# Patient Record
Sex: Male | Born: 1980 | Race: Black or African American | Hispanic: No | Marital: Married
Health system: Southern US, Community
[De-identification: ages and names within clinical notes are randomized; demographics above are authoritative.]

---

## 2018-05-14 ENCOUNTER — Emergency Department (HOSPITAL_COMMUNITY): Payer: BC Managed Care – PPO

## 2018-05-14 ENCOUNTER — Emergency Department (HOSPITAL_COMMUNITY)
Admission: EM | Admit: 2018-05-14 | Discharge: 2018-05-14 | Disposition: A | Discharge status: Home / Self Care | Payer: BC Managed Care – PPO | Attending: Emergency Medicine | Admitting: Emergency Medicine

## 2018-05-14 ENCOUNTER — Encounter (HOSPITAL_COMMUNITY): Payer: Self-pay | Admitting: Emergency Medicine

## 2018-05-14 DIAGNOSIS — Y929 Unspecified place or not applicable: Secondary | ICD-10-CM | POA: Insufficient documentation

## 2018-05-14 DIAGNOSIS — R079 Chest pain, unspecified: Secondary | ICD-10-CM | POA: Insufficient documentation

## 2018-05-14 DIAGNOSIS — Y939 Activity, unspecified: Secondary | ICD-10-CM | POA: Diagnosis not present

## 2018-05-14 DIAGNOSIS — G44319 Acute post-traumatic headache, not intractable: Secondary | ICD-10-CM | POA: Insufficient documentation

## 2018-05-14 DIAGNOSIS — R51 Headache: Secondary | ICD-10-CM | POA: Diagnosis present

## 2018-05-14 DIAGNOSIS — S86812A Strain of other muscle(s) and tendon(s) at lower leg level, left leg, initial encounter: Secondary | ICD-10-CM | POA: Insufficient documentation

## 2018-05-14 DIAGNOSIS — Y999 Unspecified external cause status: Secondary | ICD-10-CM | POA: Insufficient documentation

## 2018-05-14 MED ORDER — ACETAMINOPHEN 325 MG PO TABS
650.0000 mg | ORAL_TABLET | Freq: Once | ORAL | Status: AC
  Administered 2018-05-14: 650 mg via ORAL
  Filled 2018-05-14: qty 2

## 2018-05-14 MED ORDER — METHOCARBAMOL 500 MG PO TABS: 500.0000 mg | ORAL_TABLET | Freq: Two times a day (BID) | ORAL | 0 refills | Status: AC
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
## 2018-05-14 NOTE — Discharge Instructions (Signed)
Scan of your home and x-ray of your chest were reassuring  Please take 800 mg ibuprofen every 6 hours as needed for pain.  I also written a prescription for a muscle relaxer medicine called Robaxin.  This medicine can make you drowsy so please do not drive, work or drink alcohol while taking it.  Return to the ER if you have any new or concerning symptoms like worsening headache with vision changes, headache with vomiting, new numbness or weakness.
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
## 2018-05-15 ENCOUNTER — Encounter (HOSPITAL_COMMUNITY): Payer: Self-pay | Admitting: Emergency Medical Services
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
## 2023-05-27 ENCOUNTER — Other Ambulatory Visit: Payer: Self-pay

## 2023-05-27 ENCOUNTER — Emergency Department
Admission: EM | Admit: 2023-05-27 | Discharge: 2023-05-27 | Disposition: A | Discharge status: Home / Self Care | Payer: Self-pay | Attending: Emergency Medicine | Admitting: Emergency Medicine

## 2023-05-27 ENCOUNTER — Emergency Department: Payer: BC Managed Care – PPO

## 2023-05-27 DIAGNOSIS — Z23 Encounter for immunization: Secondary | ICD-10-CM | POA: Insufficient documentation

## 2023-05-27 DIAGNOSIS — S0101XA Laceration without foreign body of scalp, initial encounter: Secondary | ICD-10-CM | POA: Diagnosis not present

## 2023-05-27 DIAGNOSIS — Y990 Civilian activity done for income or pay: Secondary | ICD-10-CM | POA: Diagnosis not present

## 2023-05-27 DIAGNOSIS — W228XXA Striking against or struck by other objects, initial encounter: Secondary | ICD-10-CM | POA: Insufficient documentation

## 2023-05-27 DIAGNOSIS — S0990XA Unspecified injury of head, initial encounter: Secondary | ICD-10-CM

## 2023-05-27 MED ORDER — LIDOCAINE-EPINEPHRINE-TETRACAINE (LET) TOPICAL GEL
3.0000 mL | Freq: Once | TOPICAL | Status: AC
  Administered 2023-05-27: 3 mL via TOPICAL
  Filled 2023-05-27: qty 3

## 2023-05-27 MED ORDER — BACITRACIN ZINC 500 UNIT/GM EX OINT
TOPICAL_OINTMENT | Freq: Once | CUTANEOUS | Status: AC
  Administered 2023-05-27: 1 via TOPICAL
  Filled 2023-05-27: qty 0.9

## 2023-05-27 MED ORDER — TETANUS-DIPHTH-ACELL PERTUSSIS 5-2.5-18.5 LF-MCG/0.5 IM SUSY
0.5000 mL | PREFILLED_SYRINGE | Freq: Once | INTRAMUSCULAR | Status: AC
  Administered 2023-05-27: 0.5 mL via INTRAMUSCULAR
  Filled 2023-05-27: qty 0.5

## 2023-05-27 MED ORDER — ACETAMINOPHEN 325 MG PO TABS
650.0000 mg | ORAL_TABLET | Freq: Once | ORAL | Status: AC
  Administered 2023-05-27: 650 mg via ORAL
  Filled 2023-05-27: qty 2
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
## 2023-05-27 NOTE — ED Provider Notes (Signed)
[facility] Provider Note    Event Date/Time   First MD Initiated Contact with Patient 05/27/23 2130     (approximate)   History   Laceration   HPI  Leonard Taylor is a 42 y.o. male with no significant past medical history presents emergency department with a head injury.  Patient was lifting something to put on belt when he hit his head and got a laceration to the top of his scalp.      Physical Exam   Triage Vital Signs: ED Triage Vitals [05/27/23 2122]  Enc Vitals Group     BP (!) 141/98     Pulse Rate 70     Resp 18     Temp 98.1 F (36.7 C)     Temp Source Oral     SpO2 100 %     Weight 170 lb (77.1 kg)     Height 5\' 10"  (1.778 m)     Head Circumference      Peak Flow      Pain Score 8     Pain Loc      Pain Edu?      Excl. in GC?     Most recent vital signs: Vitals:   05/27/23 2122  BP: (!) 141/98  Pulse: 70  Resp: 18  Temp: 98.1 F (36.7 C)  SpO2: 100%     General: Awake, no distress.   CV:  Good peripheral perfusion. regular rate and  rhythm Resp:  Normal effort.  Abd:  No distention.   Other:  Cranial nerves II through XII grossly intact, skull some tenderness surrounding the laceration, laceration about 1 cm long, no foreign body, neurovascular intact   ED Results / Procedures / Treatments   Labs (all labs ordered are listed, but only abnormal results are displayed) Labs Reviewed - No data to display   EKG     RADIOLOGY CT of the head    PROCEDURES:   .Marland KitchenLaceration Repair  Date/Time: 05/27/2023 10:44 PM  Performed by: Faythe Ghee, PA-C Authorized by: Faythe Ghee, PA-C   Consent:    Consent obtained:  Verbal   Consent given by:  Patient   Risks, benefits, and alternatives were discussed: yes     Risks discussed:  Infection, pain and retained foreign body   Alternatives discussed:  No treatment Universal protocol:    Procedure explained and questions answered to patient or proxy's  satisfaction: yes     Immediately prior to procedure, a time out was called: yes     Patient identity confirmed:  Verbally with patient Anesthesia:    Anesthesia method:  Topical application   Topical anesthetic:  LET Laceration details:    Location:  Scalp   Scalp location:  Crown   Length (cm):  1 Exploration:    Limited defect created (wound extended): no     Hemostasis achieved with:  LET   Imaging outcome: foreign body not noted     Wound exploration: wound explored through full range of motion and entire depth of wound visualized     Wound extent: areolar tissue not violated, fascia not violated, no foreign body, no signs of injury, no nerve damage, no tendon damage, no underlying fracture and no vascular damage     Contaminated: no   Treatment:    Area cleansed with:  Saline   Amount of cleaning:  Standard   Irrigation solution:  Sterile saline   Irrigation method:  Tap Skin  repair:    Repair method:  Staples   Number of staples:  2 Approximation:    Approximation:  Close    MEDICATIONS ORDERED IN ED: Medications  Tdap (BOOSTRIX) injection 0.5 mL (has no administration in time range)  bacitracin ointment (has no administration in time range)  acetaminophen (TYLENOL) tablet 650 mg (650 mg Oral Given 05/27/23 2229)  lidocaine-EPINEPHrine-tetracaine (LET) topical gel (3 mLs Topical Given 05/27/23 2229)     IMPRESSION / MDM / ASSESSMENT AND PLAN / ED COURSE  I reviewed the triage vital signs and the nursing notes.                              Differential diagnosis includes, but is not limited to, subdural, subarachnoid, contusion, laceration  Patient's presentation is most consistent with Patient's presentation is most consistent with acute presentation with potential threat to life or bodily function.    CT of the head independently reviewed interpreted by me as being negative for acute abnormality  Procedure note for laceration repair  Patient tolerated  procedure well.  Explained CT findings.  Tdap was updated.  He is to follow-up with his regular doctor or urgent care for staple removal in 10 days.  Patient is in agreement treatment plan.  Discharged stable condition.      FINAL CLINICAL IMPRESSION(S) / ED DIAGNOSES   Final diagnoses:  Laceration of scalp, initial encounter  Minor head injury, initial encounter     Rx / DC Orders   ED Discharge Orders     None        Note:  This document was prepared using Dragon voice recognition software and may include unintentional dictation errors.    Faythe Ghee, PA-C 05/27/23 2247    Sharman Cheek, MD 05/29/23 [phone]
